# Patient Record
Sex: Female | Born: 2000 | Race: White | Hispanic: No | Marital: Single | Smoking: Never smoker
Health system: Southern US, Community
[De-identification: ages and names within clinical notes are randomized; demographics above are authoritative.]

---
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
## 2022-01-15 IMAGING — CT CT HEAD W/O CM
3 series · 15 of 47 positions shown, 18 images · non-contrast
Comparison: None.

CLINICAL DATA: Fall with head, neck, and face pain.

EXAM:
CT HEAD WITHOUT CONTRAST
CT MAXILLOFACIAL WITHOUT CONTRAST
CT CERVICAL SPINE WITHOUT CONTRAST
TECHNIQUE: Multidetector CT imaging of the head, cervical spine, and
maxillofacial structures were performed using the standard protocol
without intravenous contrast. Multiplanar CT image reconstructions
of the cervical spine and maxillofacial structures were also
generated.

[Series 2: head wo · axial · 0.39mm/px · z∈[-107,+18]mm · 9 of 30 slices shown, 12 images]
[im 3/30  brain]
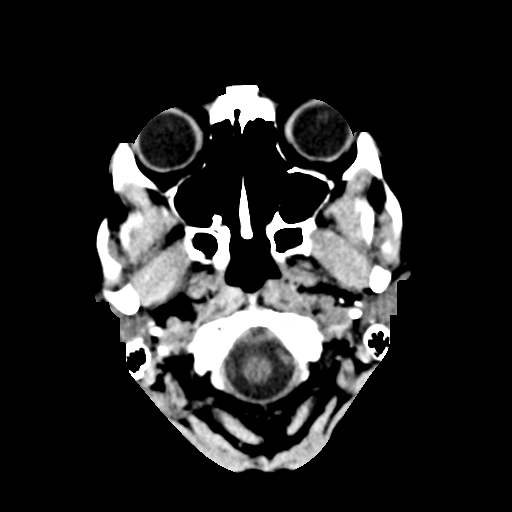
[im 3/30  bone]
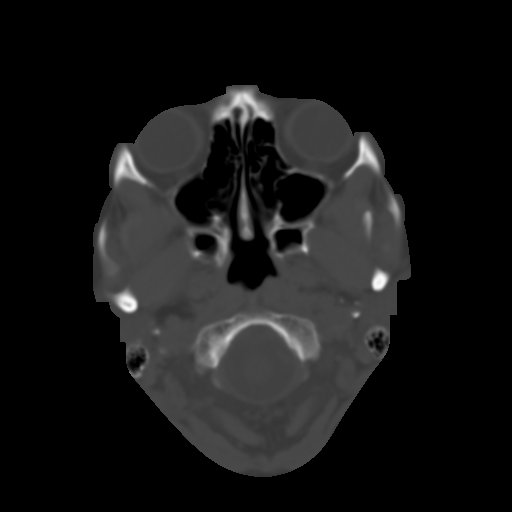
[im 6/30  brain]
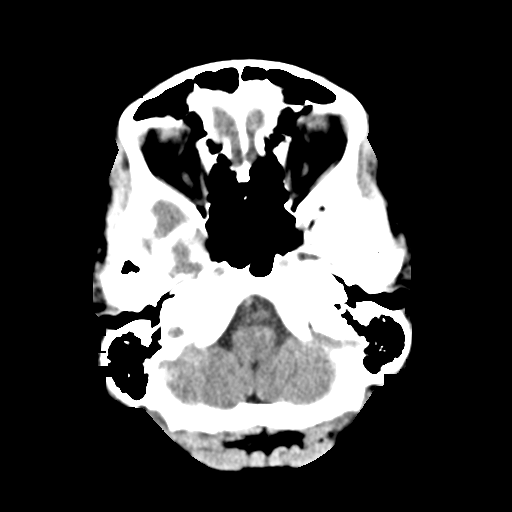
[im 9/30  brain]
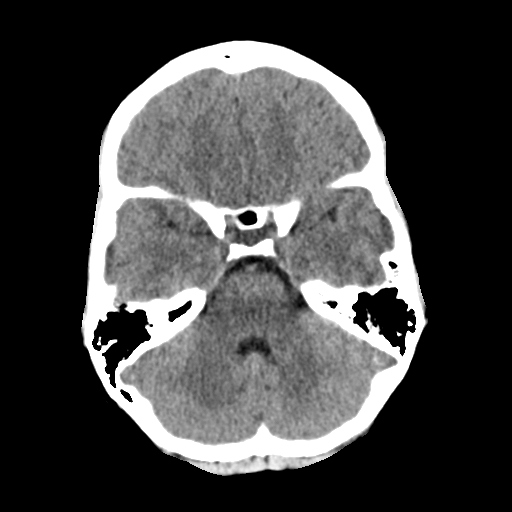
[im 12/30  brain]
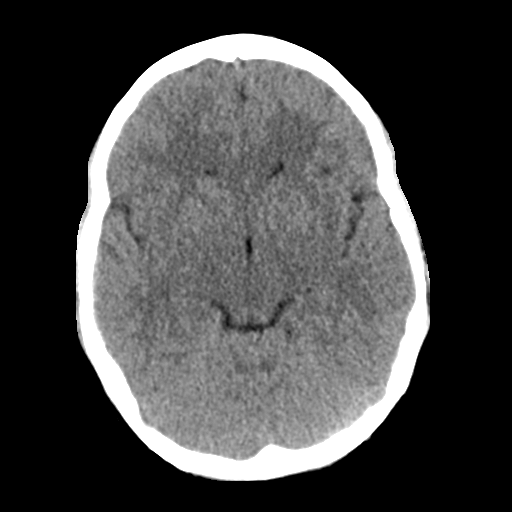
[im 16/30  brain]
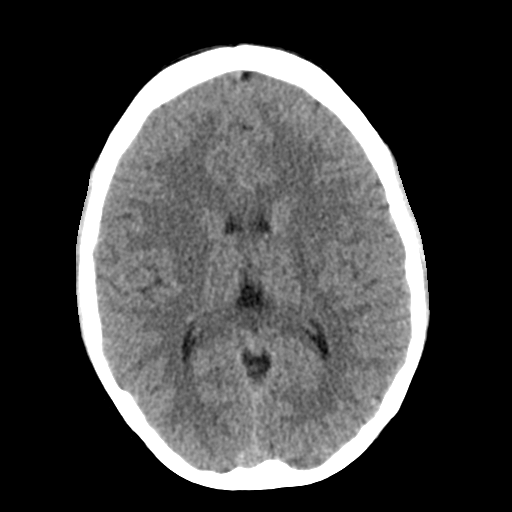
[im 16/30  bone]
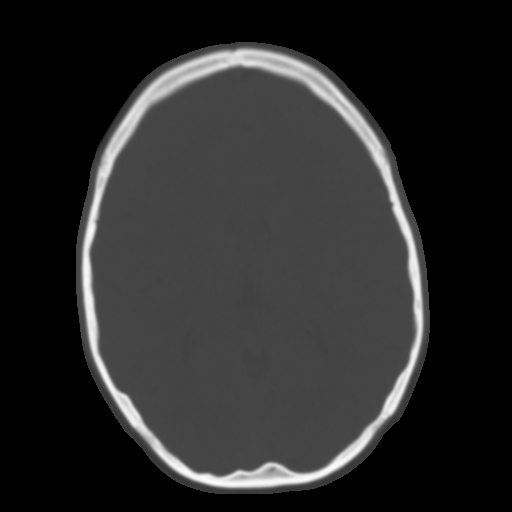
[im 19/30  brain]
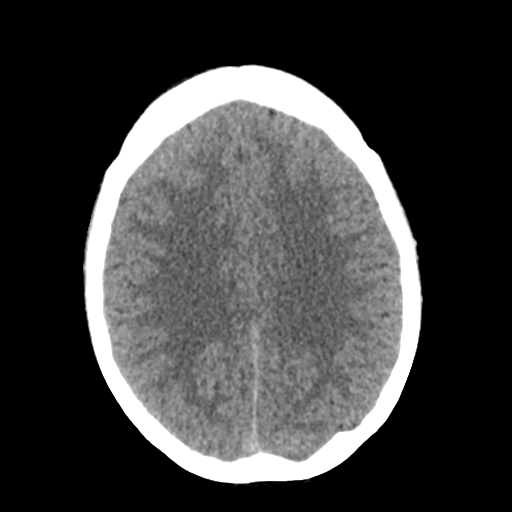
[im 22/30  brain]
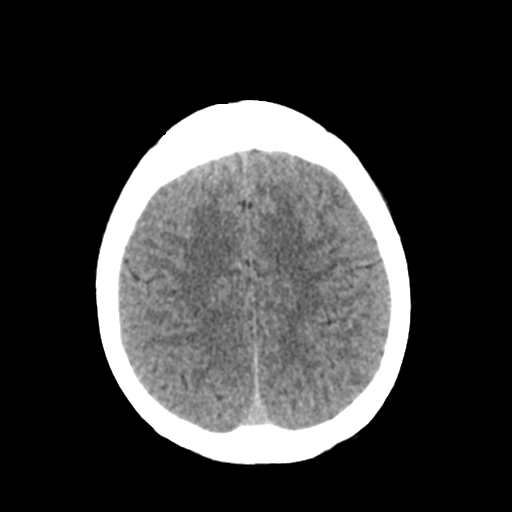
[im 25/30  brain]
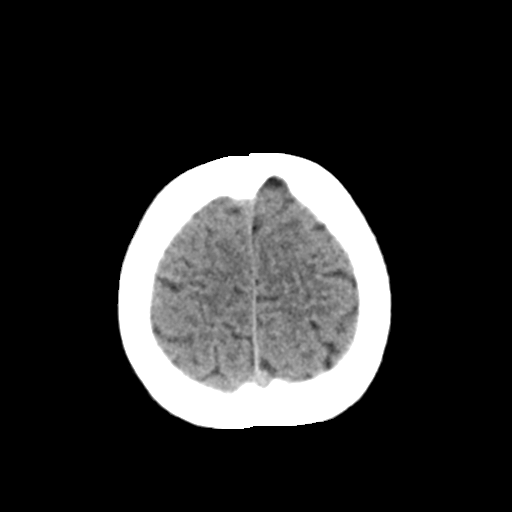
[im 28/30  brain]
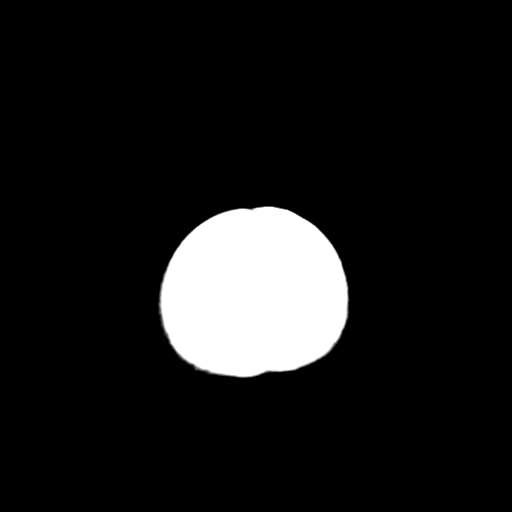
[im 28/30  bone]
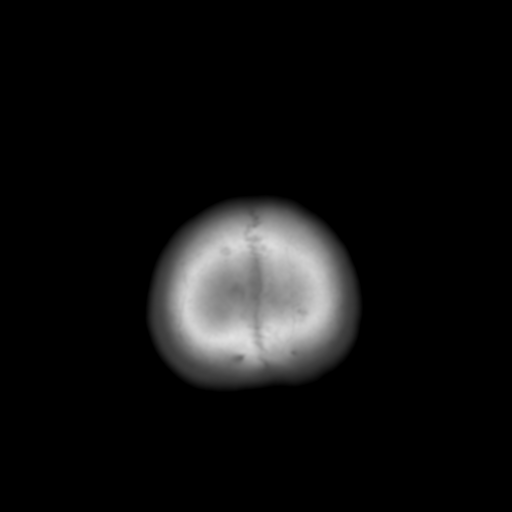

[Series 4: coronal soft tissue · coronal · 0.32mm/px · 3 of 67 slices shown]
[im 23/67  brain]
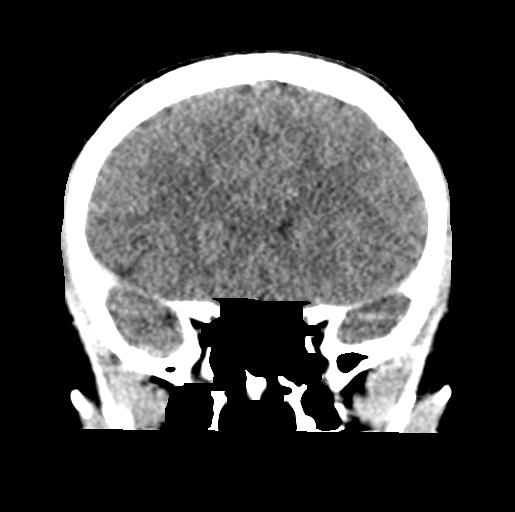
[im 30/67  brain]
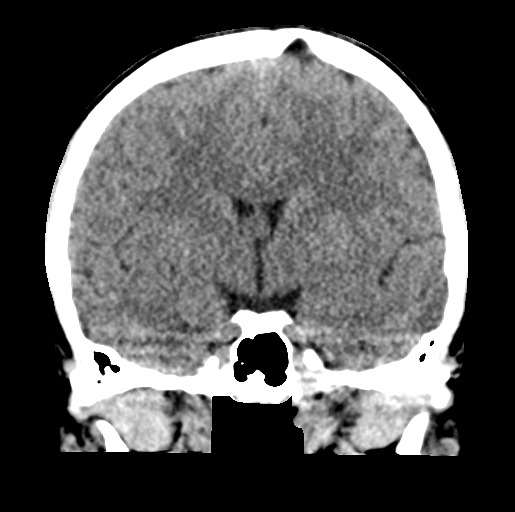
[im 37/67  brain]
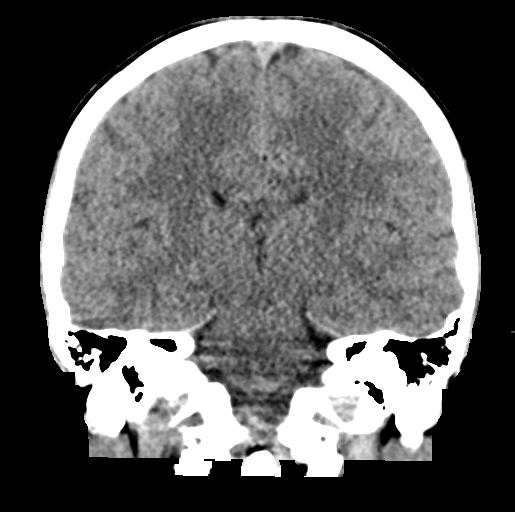

[Series 5: sagittal soft tissue · sagittal · 0.32mm/px · 3 of 57 slices shown]
[im 19/57  brain]
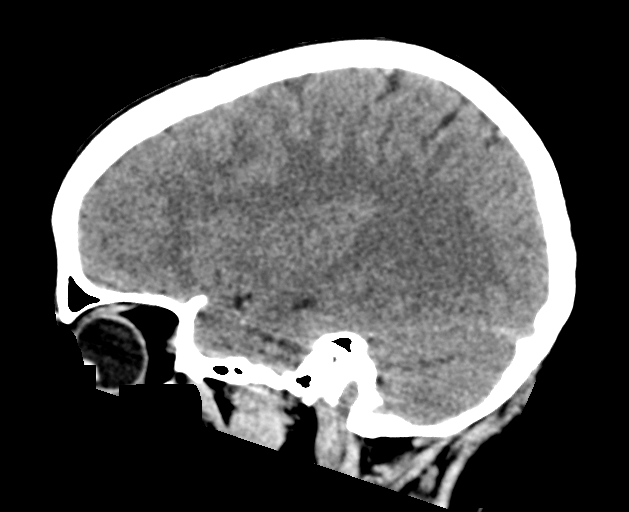
[im 29/57  brain]
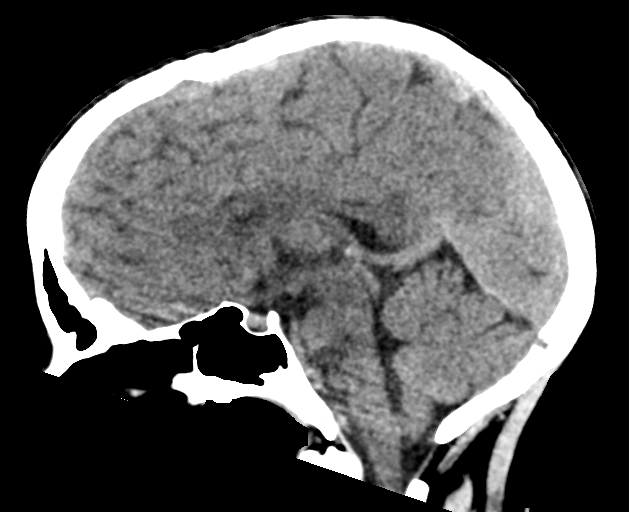
[im 38/57  brain]
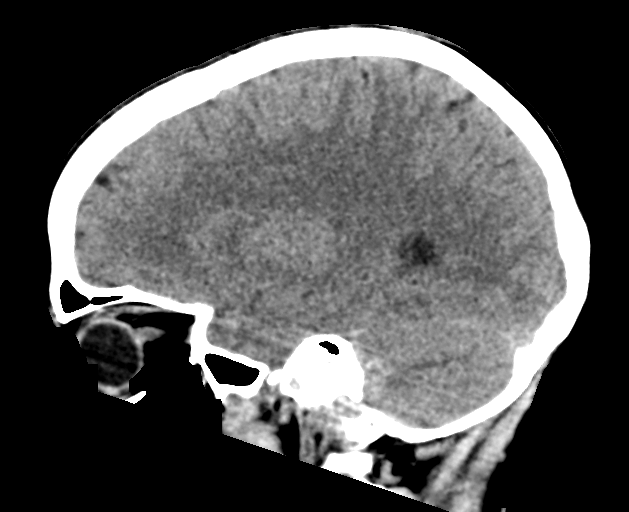

[15 of 47 positions shown; findings below may reference images not displayed]

FINDINGS: CT HEAD FINDINGS

Brain: No evidence of acute infarction, hemorrhage, hydrocephalus,
extra-axial collection or mass lesion/mass effect.

Vascular: No hyperdense vessel or unexpected calcification.

Skull: Normal. Negative for fracture or focal lesion.

Other: None.

CT MAXILLOFACIAL FINDINGS

Osseous: No fracture or mandibular dislocation. No destructive
process.

Orbits: Negative. No traumatic or inflammatory finding.

Sinuses: There is mild bilateral maxillary sinus disease.

Soft tissues: There is soft tissue swelling of the upper lip.

CT CERVICAL SPINE FINDINGS

Alignment: Normal.

Skull base and vertebrae: No acute fracture. No primary bone lesion
or focal pathologic process.

Soft tissues and spinal canal: No prevertebral fluid or swelling. No
visible canal hematoma.

Disc levels:  Preserved.

Upper chest: Negative.

Other: None.
IMPRESSION: 1. No acute intracranial abnormality.
2. No evidence of acute facial fracture.
3. No evidence of acute traumatic injury to the cervical spine.
